# Patient Record
Sex: Female | Born: 1995 | Race: White | Hispanic: No | Marital: Single
Health system: Southern US, Community
[De-identification: ages and names within clinical notes are randomized; demographics above are authoritative.]

---

## 2017-09-06 ENCOUNTER — Other Ambulatory Visit: Payer: Self-pay

## 2017-09-06 ENCOUNTER — Emergency Department
Admission: EM | Admit: 2017-09-06 | Discharge: 2017-09-06 | Disposition: A | Discharge status: Home / Self Care | Attending: Emergency Medicine | Admitting: Emergency Medicine

## 2017-09-06 ENCOUNTER — Emergency Department

## 2017-09-06 DIAGNOSIS — Z79899 Other long term (current) drug therapy: Secondary | ICD-10-CM | POA: Diagnosis not present

## 2017-09-06 DIAGNOSIS — J40 Bronchitis, not specified as acute or chronic: Secondary | ICD-10-CM | POA: Diagnosis not present

## 2017-09-06 DIAGNOSIS — R05 Cough: Secondary | ICD-10-CM | POA: Diagnosis present

## 2017-09-06 LAB — CBC WITH DIFFERENTIAL/PLATELET
BASOS ABS: 0.1 10*3/uL (ref 0–0.1)
BASOS PCT: 1 %
EOS ABS: 0 10*3/uL (ref 0–0.7)
Eosinophils Relative: 0 %
HEMATOCRIT: 46.8 % (ref 35.0–47.0)
Hemoglobin: 15.8 g/dL (ref 12.0–16.0)
Lymphocytes Relative: 20 %
Lymphs Abs: 2.2 10*3/uL (ref 1.0–3.6)
MCH: 31.8 pg (ref 26.0–34.0)
MCHC: 33.9 g/dL (ref 32.0–36.0)
MCV: 93.7 fL (ref 80.0–100.0)
MONO ABS: 1 10*3/uL — AB (ref 0.2–0.9)
Monocytes Relative: 10 %
NEUTROS ABS: 7.5 10*3/uL — AB (ref 1.4–6.5)
Neutrophils Relative %: 69 %
Platelets: 305 10*3/uL (ref 150–440)
RBC: 4.99 MIL/uL (ref 3.80–5.20)
RDW: 12.8 % (ref 11.5–14.5)
WBC: 10.9 10*3/uL (ref 3.6–11.0)

## 2017-09-06 LAB — BASIC METABOLIC PANEL
ANION GAP: 8 (ref 5–15)
BUN: 15 mg/dL (ref 6–20)
CALCIUM: 9.2 mg/dL (ref 8.9–10.3)
CO2: 28 mmol/L (ref 22–32)
Chloride: 101 mmol/L (ref 101–111)
Creatinine, Ser: 0.68 mg/dL (ref 0.44–1.00)
Glucose, Bld: 98 mg/dL (ref 65–99)
Potassium: 3.3 mmol/L — ABNORMAL LOW (ref 3.5–5.1)
SODIUM: 137 mmol/L (ref 135–145)

## 2017-09-06 MED ORDER — ALBUTEROL SULFATE HFA 108 (90 BASE) MCG/ACT IN AERS: 2.0000 | INHALATION_SPRAY | Freq: Four times a day (QID) | RESPIRATORY_TRACT | 0 refills | Status: AC | PRN

## 2017-09-06 MED ORDER — ALBUTEROL SULFATE (2.5 MG/3ML) 0.083% IN NEBU
5.0000 mg | INHALATION_SOLUTION | Freq: Once | RESPIRATORY_TRACT | Status: AC
  Administered 2017-09-06: 5 mg via RESPIRATORY_TRACT
  Filled 2017-09-06: qty 6

## 2017-09-06 MED ORDER — PREDNISONE 20 MG PO TABS: 40.0000 mg | ORAL_TABLET | Freq: Every day | ORAL | 0 refills | Status: AC

## 2017-09-06 MED ORDER — PREDNISONE 20 MG PO TABS
60.0000 mg | ORAL_TABLET | Freq: Once | ORAL | Status: AC
  Administered 2017-09-06: 60 mg via ORAL
  Filled 2017-09-06: qty 3

## 2017-09-06 NOTE — ED Provider Notes (Addendum)
[facility] Emergency Department Provider Note  ___________________________________________   First MD Initiated Contact with Patient 09/06/17 2104     (approximate)  I have reviewed the triage vital signs and the nursing notes.   HISTORY  Chief Complaint Cough   HPI Briana Powell is a 22 y.o. female without any chronic medical conditions who is presenting to the emergency department today with a cough for 3 weeks.  Intermittently productive.  No pain.  Says that she has been on a steroid which she finished today.  Also taking Levaquin, day 3 of 7.  Also using a cough medicine with codeine.  Initially, 3 weeks ago, was also placed on 3 times daily cough medicine.  Says that nothing is helping her condition.  Says that her voice sounds hoarse.  Says that she knows that she is wheezing and feels a raspiness when she breathes out.  Says that she also had a runny nose when the illness initially began.  However, this has stopped.   No past medical history on file.  There are no active problems to display for this patient.     Prior to Admission medications   Medication Sig Start Date End Date Taking? Authorizing Provider  guaiFENesin-codeine (ROBITUSSIN AC) 100-10 MG/5ML syrup Take 10 mLs by mouth 3 (three) times daily as needed for cough.   Yes [provider]  levofloxacin (LEVAQUIN) 750 MG tablet Take 750 mg by mouth daily.   Yes [provider]    Allergies Patient has no known allergies.  No family history on file.  Social History Social History   Tobacco Use  . Smoking status: Not on file  Substance Use Topics  . Alcohol use: Not on file  . Drug use: Not on file    Review of Systems  Constitutional: No fever/chills Eyes: No visual changes. ENT: No sore throat. Cardiovascular: Denies chest pain. Respiratory:  as above Gastrointestinal: No abdominal pain.  No nausea, no vomiting.  No diarrhea.  No  constipation. Genitourinary: Negative for dysuria. Musculoskeletal: Negative for back pain. Skin: Negative for rash. Neurological: Negative for headaches, focal weakness or numbness.   ____________________________________________   PHYSICAL EXAM:  VITAL SIGNS: ED Triage Vitals [09/06/17 2027]  Enc Vitals Group     BP (!) 142/97     Pulse Rate 70     Resp      Temp 97.9 F (36.6 C)     Temp Source Oral     SpO2 100 %     Weight 130 lb (59 kg)     Height  (1.778 m)     Head Circumference      Peak Flow      Pain Score 3     Pain Loc      Pain Edu?      Excl. in GC?     Constitutional: Alert and oriented. Well appearing and in no acute distress. Eyes: Conjunctivae are normal.  Head: Atraumatic. Nose: No congestion/rhinnorhea. Mouth/Throat: Mucous membranes are moist.  No pharyngeal erythema.  No tonsillar swelling or exudates. Neck: No stridor.   Cardiovascular: Normal rate, regular rhythm. Grossly normal heart sounds.   Respiratory: Normal respiratory effort.  No retractions.  Coarse wheezing throughout with a mildly prolonged expiratory phase.  No expiratory cough. Gastrointestinal: Soft and nontender. No distention. No CVA tenderness. Musculoskeletal: No lower extremity tenderness nor edema.  No joint effusions. Neurologic:  Normal speech and language. No gross focal neurologic deficits are appreciated.  Skin:  Skin is warm, dry and intact. No rash noted. Psychiatric: Mood and affect are normal. Speech and behavior are normal.  ____________________________________________   LABS (all labs ordered are listed, but only abnormal results are displayed)  Labs Reviewed  CBC WITH DIFFERENTIAL/PLATELET - Abnormal; Notable for the following components:      Result Value   Neutro Abs 7.5 (*)    Monocytes Absolute 1.0 (*)    All other components within normal limits  BASIC METABOLIC PANEL - Abnormal; Notable for the following components:   Potassium 3.3 (*)    All  other components within normal limits   ____________________________________________  EKG  ED ECG REPORT I, Arelia Longest, the attending physician, personally viewed and interpreted this ECG.   Date: 09/06/2017  EKG Time: 2051  Rate: 75  Rhythm: normal sinus rhythm  Axis: Normal  Intervals:none  ST&T Change: No ST segment elevation or depression.  Single T wave inversion in V3.  ____________________________________________  RADIOLOGY  No acute finding on the chest x-ray ____________________________________________   PROCEDURES  Procedure(s) performed:   Procedures  Critical Care performed:   ____________________________________________   INITIAL IMPRESSION / ASSESSMENT AND PLAN / ED COURSE  Pertinent labs & imaging results that were available during my care of the patient were reviewed by me and considered in my medical decision making (see chart for details).  Differential includes, but is not limited to, viral syndrome, bronchitis including COPD exacerbation, pneumonia, reactive airway disease including asthma, CHF including exacerbation with or without pulmonary/interstitial edema, pneumothorax, ACS, thoracic trauma, and pulmonary embolism. As part of my medical decision making, I reviewed the following data within the electronic MEDICAL RECORD NUMBER Notes from prior ED visits  ----------------------------------------- 10:42 PM on 09/06/2017 -----------------------------------------  Reassessed and the patient still has an intermittent raspy cough.  However, her lungs are clear to auscultation throughout.  Reassuring chest x-ray as well as lab work.  I will extend her course of steroids and give her an inhaler for symptomatic relief.  She will continue her other medications as prescribed.  Patient is nontoxic in appearance.  Likely bronchitis. ____________________________________________   FINAL CLINICAL IMPRESSION(S) / ED DIAGNOSES  Bronchitis.    NEW  MEDICATIONS STARTED DURING THIS VISIT:  New Prescriptions   No medications on file     Note:  This document was prepared using Dragon voice recognition software and may include unintentional dictation errors.     Myrna Blazer, MD 09/06/17 2243  Pt has already taken 7 days of Levaquin.  Will D/C.  To continue with steroids.     Myrna Blazer, MD 09/06/17 [phone]

## 2017-09-06 NOTE — ED Triage Notes (Signed)
Pt ambulatory to triage with no difficulty. Pt reports she was dx'd with pneumonia this past Monday. Has had sx for about 3 weeks that started with a cough and wheezing. Started on levaquin 750 mg a day on /^/19 and a prednisone taper the same day. 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 Pt concerned she is not getting any better.

## 2019-10-26 IMAGING — CR DG CHEST 2V
3 series · 3 of 3 positions shown · non-contrast
Comparison: None.

CLINICAL DATA: Acute shortness of breath.

EXAM:
CHEST - 2 VIEW

[chest pa]
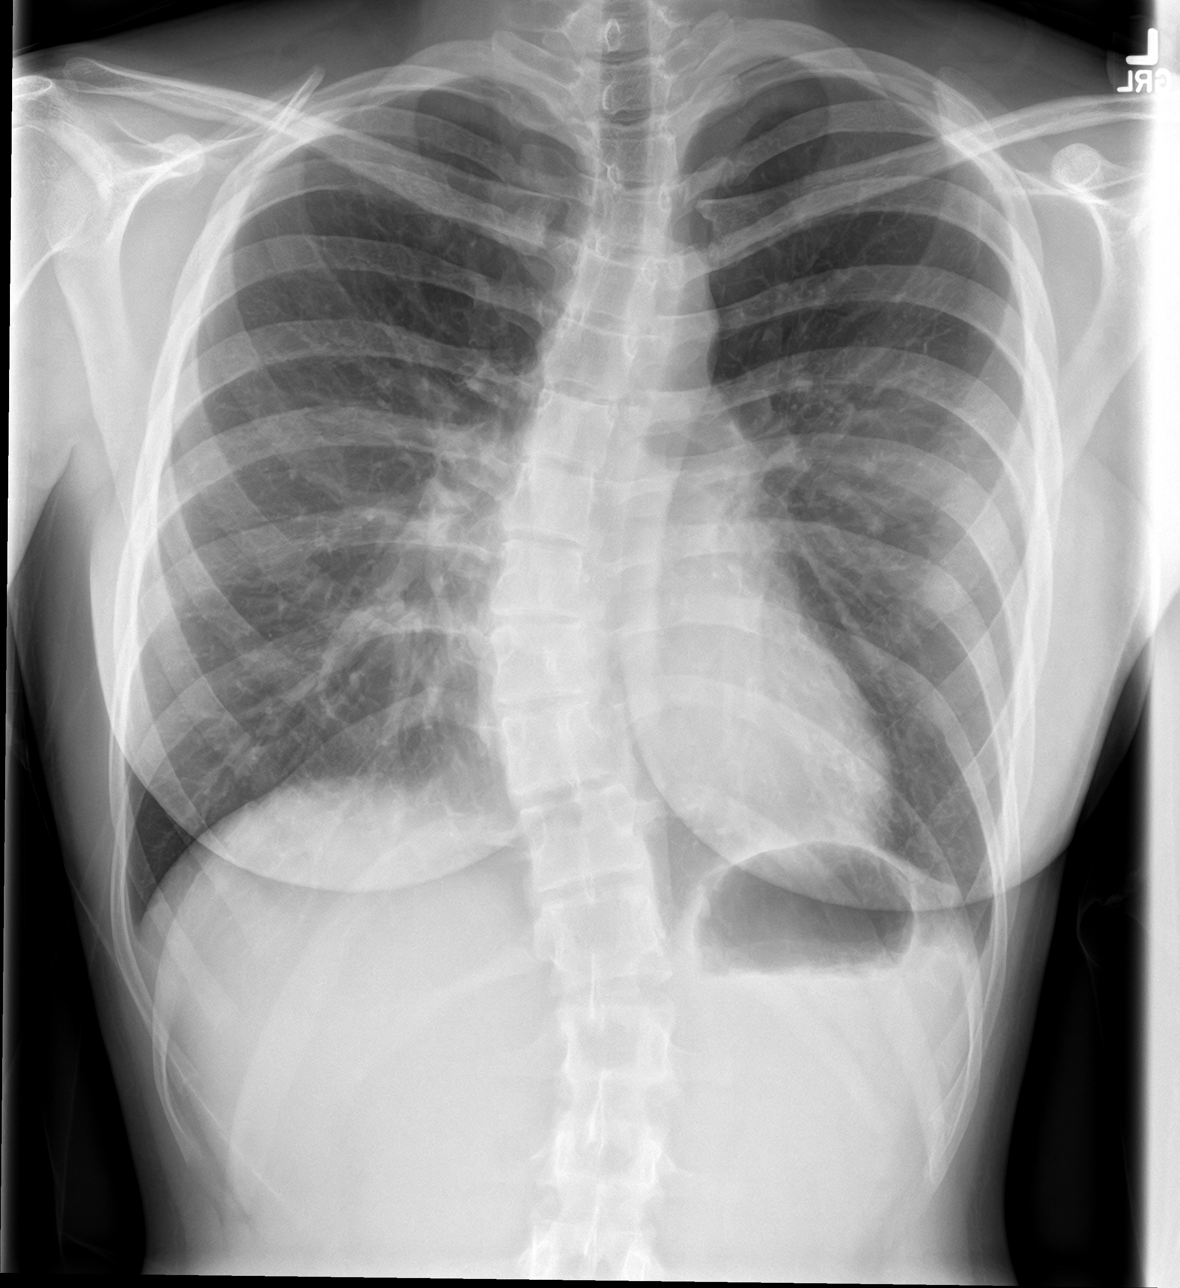

[chest lat (1 of 2)]
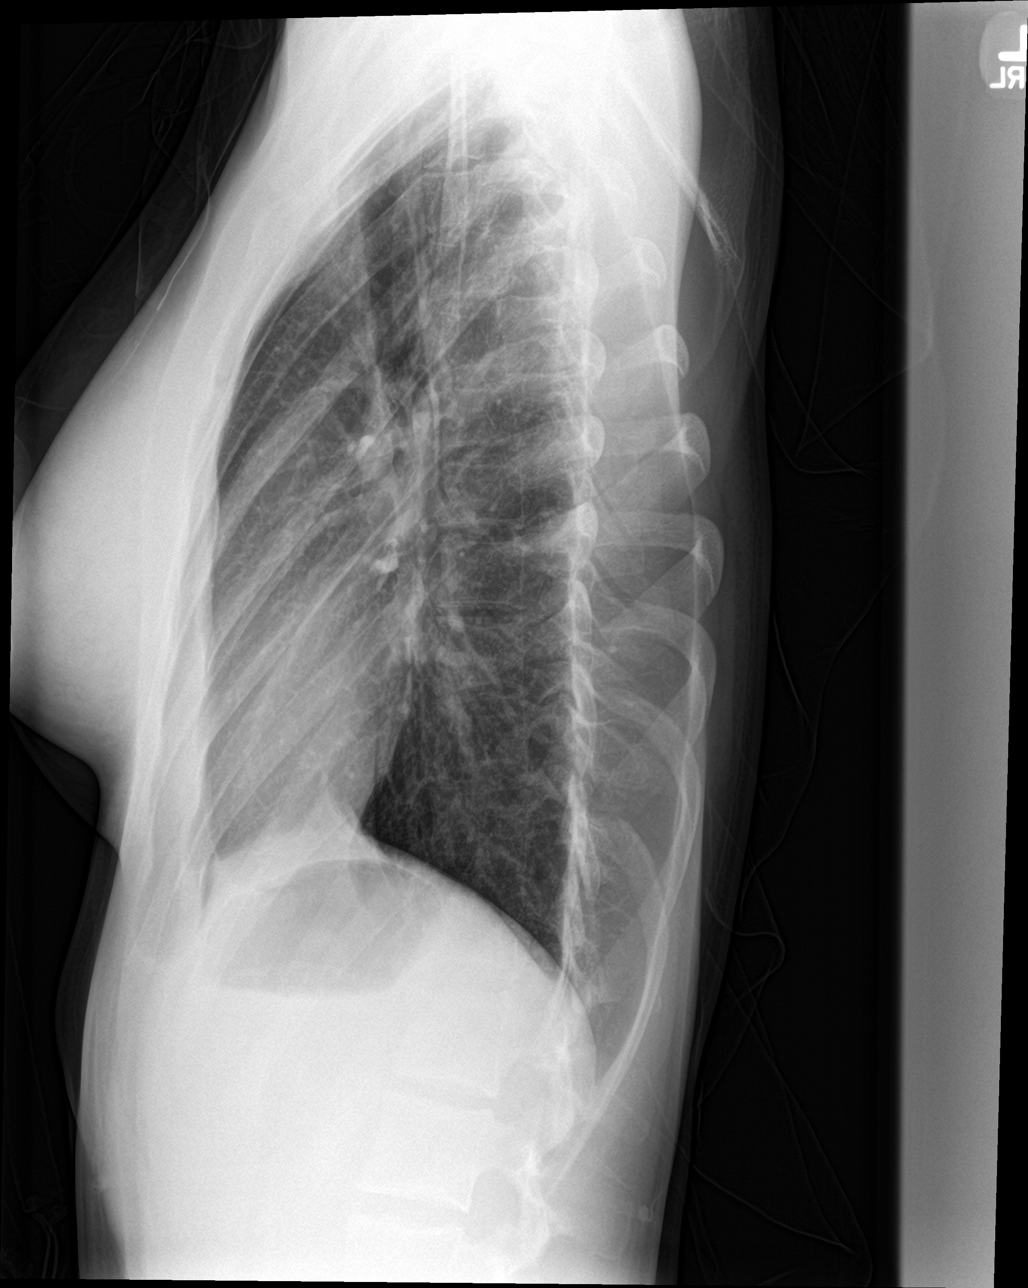

[chest lat (2 of 2)]
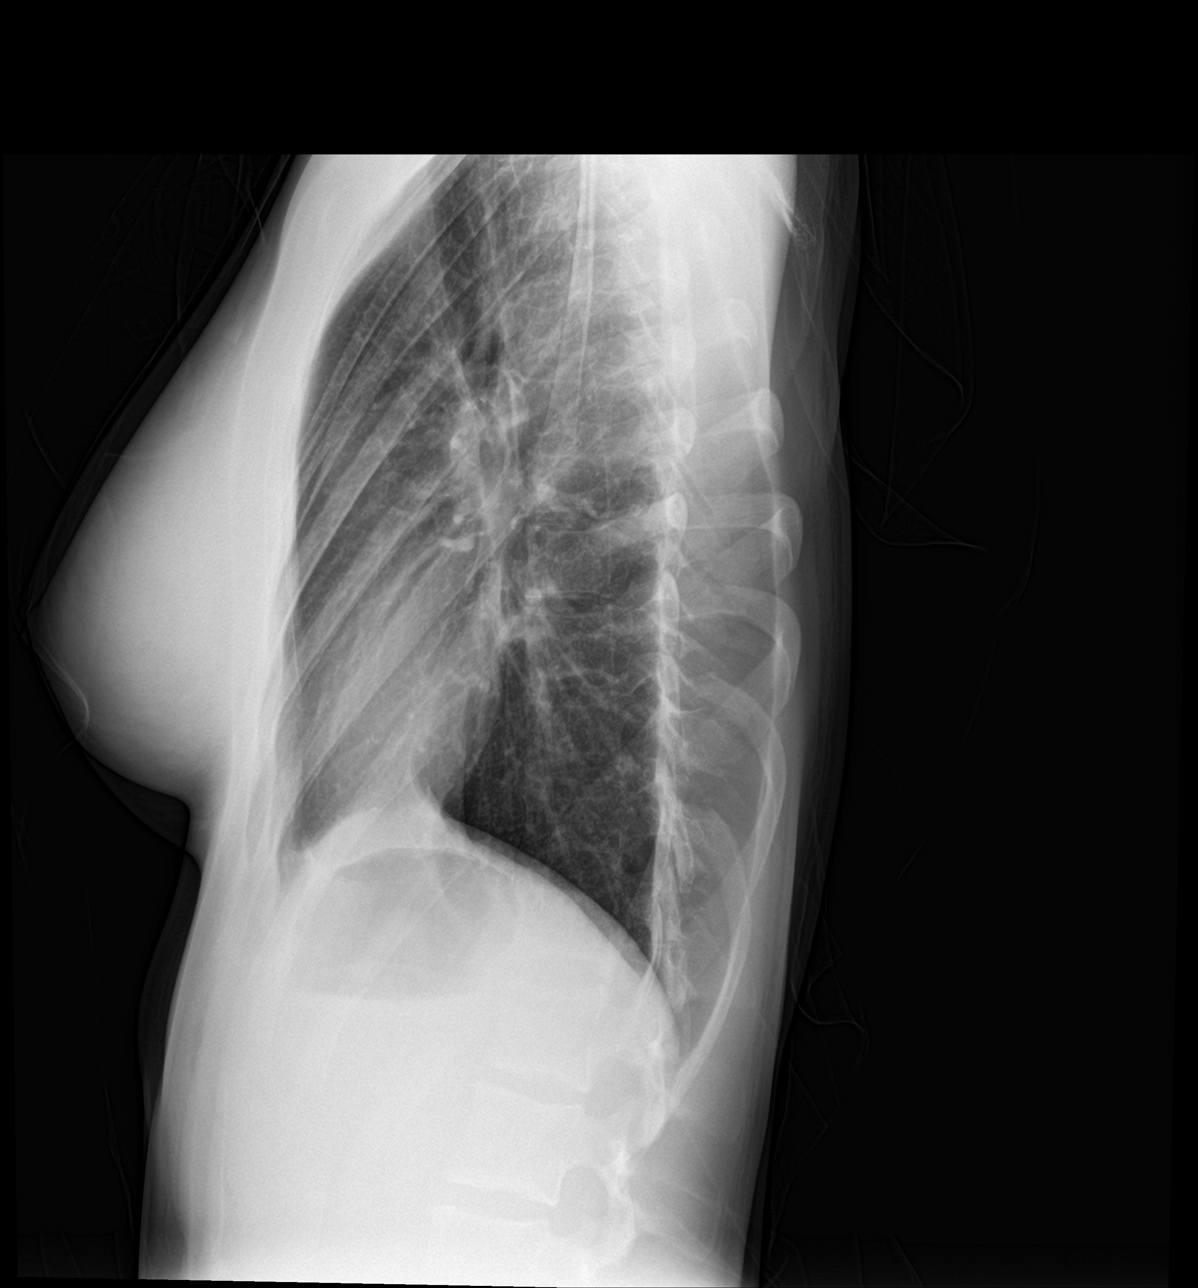

[3 of 3 positions shown; findings below may reference images not displayed]

FINDINGS: The cardiomediastinal silhouette is unremarkable.

There is no evidence of focal airspace disease, pulmonary edema,
suspicious pulmonary nodule/mass, pleural effusion, or pneumothorax.

No acute bony abnormalities are identified. A thoracolumbar
scoliosis is noted.
IMPRESSION: No active cardiopulmonary disease.
